# Patient Record
Sex: Female | Born: 1970 | Race: White | Hispanic: No | Marital: Married | Smoking: Never smoker
Health system: Southern US, Community
[De-identification: ages and names within clinical notes are randomized; demographics above are authoritative.]

## PROBLEM LIST (undated history)

## (undated) DIAGNOSIS — I10 Essential (primary) hypertension: Secondary | ICD-10-CM

## (undated) HISTORY — PX: ABDOMINAL SURGERY: SHX537

---

## 2007-04-11 ENCOUNTER — Ambulatory Visit (HOSPITAL_COMMUNITY): Admission: RE | Admit: 2007-04-11 | Discharge: 2007-04-11 | Payer: Self-pay | Admitting: *Deleted

## 2007-05-08 ENCOUNTER — Ambulatory Visit (HOSPITAL_COMMUNITY): Admission: RE | Admit: 2007-05-08 | Discharge: 2007-05-08 | Payer: Self-pay | Admitting: *Deleted

## 2007-09-05 ENCOUNTER — Inpatient Hospital Stay (HOSPITAL_COMMUNITY): Admission: RE | Admit: 2007-09-05 | Discharge: 2007-09-08 | Payer: Self-pay | Admitting: *Deleted

## 2010-08-01 ENCOUNTER — Encounter: Payer: Self-pay | Admitting: *Deleted

## 2010-11-23 NOTE — Op Note (Signed)
NAMEMARLY, Connie [name]               ACCOUNT NO.:  [id number]   MEDICAL RECORD NO.:  [id number]          PATIENT TYPE:  INP   LOCATION:  9109                          FACILITY:  [facility]   PHYSICIAN:  Connie [name], M.D.  DATE OF BIRTH:  Apr 16, 1971   DATE OF PROCEDURE:  09/05/2007  DATE OF DISCHARGE:                               OPERATIVE REPORT   PREOPERATIVE DIAGNOSES:  1. A 39-week intrauterine pregnancy.  2. Breech presentation.   POSTOPERATIVE DIAGNOSIS:  1. A 39-week intrauterine pregnancy.  2. Breech presentation.   PROCEDURE:  Primary low transverse C-section.   SURGEON:  Chester Holstein. [name], M.D.   ASSISTANT:  [name], C.N.M.   ANESTHESIA:  Spinal.   FINDINGS:  Viable female, 8 pounds 1 ounce, frank breech, Apgars 4 and  9.   SPECIMENS:  None.   BLOOD LOSS:  600.   COMPLICATIONS:  None.   Cord pH sent but quantity was not sufficient.   INDICATIONS:  The patient with above history for surgical delivery.  Risks discussed including infection, bleeding, damage to surrounding  organs.   PROCEDURE:  The patient is taken to the operating room.  Spinal  anesthesia obtained.  She is prepped and draped in standard fashion.  Foley catheter inserted to the bladder.  A Pfannenstiel incision was  made, carried sharply to the fascia.  Fascia was divided sharply,  elevated and the underlying rectus muscles dissected off sharply.  Posterior sheath and peritoneum elevated and sharply.  Bladder blade  inserted.  Bladder flap created sharply.   Uterine incision made in low transverse fashion with a knife.  Extended  laterally with bandage scissors.  Clear fluid at amniotomy.  The breech  was elevated to the incision, with fundal pressure delivered, frank  breech encountered.  Therefore, delivered beyond the level of the knees.  The knees were flexed.  Traction placed at the superior iliac crest to  deliver to the level of the scapula.  Each arm was delivered by flexion  of  the elbow.  Head was entrapped, therefore the abdominal and uterine  incisions were extended to facilitate delivery which was accomplished  with fundal pressure and flexion of the neck.   Placenta was removed manually.  Uterus exteriorized, cleared of all  clots and debris.  Uterus closed in two layers with 0-0 chromic.  Hemostasis obtained.  Tubes and ovaries appeared normal.   Uterus inserted into the abdomen, the pelvis irrigated.  The uterine  incision reinspected, found to be hemostatic as was subfascial space and  bladder flap.   Fascia closed with running stitch of 0-0 Vicryl.  Subcutaneous tissue  irrigated, made hemostatic and reapproximated with 0-0 Vicryl.  Skin  closed with staples.   The patient tolerated the procedure well without complications.  She was  taken to recovery room, awake, alert, in stable condition.  All counts  correct per the operating staff.      [name], M.D.  Electronically Signed     WBD/MEDQ  D:  09/05/2007  T:  09/06/2007  Job:  [id number]

## 2010-11-26 NOTE — Discharge Summary (Signed)
[name], [name]               ACCOUNT NO.:  [id number]   MEDICAL RECORD NO.:  [id number]          PATIENT TYPE:  INP   LOCATION:  9109                          FACILITY:  [facility]   PHYSICIAN:  [place] B. Earlene Plater, M.D.  DATE OF BIRTH:  [birth year]   DATE OF ADMISSION:  09/05/2007  DATE OF DISCHARGE:  09/08/2007                               DISCHARGE SUMMARY   ADMISSION DIAGNOSES:  1. A 39-week intrauterine pregnancy.  2. Breech presentation.   DISCHARGE DIAGNOSES:  1. A 39-week intrauterine pregnancy.  2. Breech presentation.   PROCEDURE:  Primary low transverse C-section.   Connie [name] presented at 39 weeks for primary C-section due to breech  presentation after declining attempted ECV.   HOSPITAL COURSE:  The patient was admitted and underwent a primary low  transverse C-section, frank breech female, weight 8 pounds 1 ounce,  Apgars 3 and 9.   Postoperatively, the patient rapidly regained her ability to ambulate,  void, and tolerate regular diet.  She was discharged home on the third  postoperative day in a satisfactory condition.   DISCHARGE INSTRUCTIONS:  Per booklet.  Follow up in 1 week for blood  pressure check as she did have mildly elevated blood pressures while in  the hospital.   DISPOSITION AT DISCHARGE:  Satisfactorily.   DISCHARGE MEDICATIONS:  Tylox 1-2 p.o. q.4 hours p.r.n. pain.      [name], M.D.  Electronically Signed     WBD/MEDQ  D:  10/01/2007  T:  10/01/2007  Job:  [id number]

## 2011-04-01 LAB — CBC
HCT: 29.2 — ABNORMAL LOW
Hemoglobin: 10.1 — ABNORMAL LOW
MCHC: 34.7
MCV: 84.4
RBC: 4.03
RDW: 15
WBC: 10.4
WBC: 11.1 — ABNORMAL HIGH

## 2011-04-01 LAB — COMPREHENSIVE METABOLIC PANEL
Albumin: 1.9 — ABNORMAL LOW
Alkaline Phosphatase: 233 — ABNORMAL HIGH
BUN: 6
Calcium: 7.9 — ABNORMAL LOW
Creatinine, Ser: 0.56
GFR calc Af Amer: >60
GFR calc non Af Amer: >60
Glucose, Bld: 86
Total Bilirubin: 0.6
Total Protein: 4.7 — ABNORMAL LOW

## 2012-07-16 ENCOUNTER — Ambulatory Visit: Payer: Self-pay | Admitting: Emergency Medicine

## 2013-05-16 ENCOUNTER — Ambulatory Visit: Payer: Self-pay | Admitting: Emergency Medicine

## 2013-05-16 LAB — CBC WITH DIFFERENTIAL/PLATELET
Basophil #: 0.1 10*3/uL (ref 0.0–0.1)
Basophil %: 0.9 %
Eosinophil #: 0.2 10*3/uL (ref 0.0–0.7)
Eosinophil %: 2.1 %
HCT: 41.4 % (ref 35.0–47.0)
HGB: 13.8 g/dL (ref 12.0–16.0)
Lymphocyte #: 1.7 10*3/uL (ref 1.0–3.6)
Lymphocyte %: 19.2 %
MCH: 30.3 pg (ref 26.0–34.0)
MCHC: 33.2 g/dL (ref 32.0–36.0)
MCV: 91 fL (ref 80–100)
Monocyte #: 0.8 x10 3/mm (ref 0.2–0.9)
Monocyte %: 8.5 %
Neutrophil #: 6.2 10*3/uL (ref 1.4–6.5)
Neutrophil %: 69.3 %
Platelet: 324 10*3/uL (ref 150–440)
RBC: 4.54 10*6/uL (ref 3.80–5.20)
RDW: 13.2 % (ref 11.5–14.5)
WBC: 8.9 10*3/uL (ref 3.6–11.0)

## 2013-05-16 LAB — RAPID STREP-A WITH REFLX: Micro Text Report: NEGATIVE

## 2013-10-30 ENCOUNTER — Ambulatory Visit: Payer: Self-pay | Admitting: Family Medicine

## 2014-04-01 ENCOUNTER — Ambulatory Visit: Payer: Self-pay | Admitting: Obstetrics and Gynecology

## 2014-05-12 DIAGNOSIS — G4733 Obstructive sleep apnea (adult) (pediatric): Secondary | ICD-10-CM | POA: Insufficient documentation

## 2014-08-31 ENCOUNTER — Ambulatory Visit: Payer: Self-pay | Admitting: Emergency Medicine

## 2015-05-29 ENCOUNTER — Ambulatory Visit
Admission: EM | Admit: 2015-05-29 | Discharge: 2015-05-29 | Disposition: A | Discharge status: Home / Self Care | Payer: BC Managed Care – PPO | Attending: Family Medicine | Admitting: Family Medicine

## 2015-05-29 DIAGNOSIS — J029 Acute pharyngitis, unspecified: Secondary | ICD-10-CM | POA: Diagnosis not present

## 2015-05-29 DIAGNOSIS — J069 Acute upper respiratory infection, unspecified: Secondary | ICD-10-CM | POA: Diagnosis not present

## 2015-05-29 LAB — RAPID STREP SCREEN (MED CTR MEBANE ONLY): Streptococcus, Group A Screen (Direct): NEGATIVE

## 2015-05-29 MED ORDER — GUAIFENESIN-CODEINE 100-10 MG/5ML PO SOLN: 10.0000 mL | Freq: Four times a day (QID) | ORAL | Status: DC | PRN

## 2015-05-29 MED ORDER — LIDOCAINE VISCOUS 2 % MT SOLN: OROMUCOSAL | Status: DC

## 2015-05-29 NOTE — ED Provider Notes (Signed)
CSN: [id number]     Arrival date & time 05/29/15  0907 History   First MD Initiated Contact with Patient 05/29/15 1003     Chief Complaint  Patient presents with  . Sore Throat   (Consider location/radiation/quality/duration/timing/severity/associated sxs/prior Treatment) Patient is a 44 y.o. female presenting with URI. The history is provided by the patient.  URI Presenting symptoms: congestion, cough, ear pain, rhinorrhea and sore throat   Severity:  Moderate Onset quality:  Sudden Duration:  2 days Timing:  Constant Progression:  Worsening Chronicity:  New Relieved by:  None tried Worsened by:  Nothing tried Ineffective treatments:  None tried Associated symptoms: headaches   Associated symptoms: no arthralgias, no myalgias, no neck pain, no sinus pain, no sneezing and no wheezing     History reviewed. No pertinent past medical history. Past Surgical History  Procedure Laterality Date  . Cesarean section      2009   Family History  Problem Relation Age of Onset  . Heart attack Father    Social History  Substance Use Topics  . Smoking status: Never Smoker   . Smokeless tobacco: None  . Alcohol Use: No   OB History    No data available     Review of Systems  HENT: Positive for congestion, ear pain, rhinorrhea and sore throat. Negative for sneezing.   Respiratory: Positive for cough. Negative for wheezing.   Musculoskeletal: Negative for myalgias, arthralgias and neck pain.  Neurological: Positive for headaches.    Allergies  Review of patient's allergies indicates no known allergies.  Home Medications   Prior to Admission medications   Medication Sig Start Date End Date Taking? Authorizing Provider  norethindrone-ethinyl estradiol (JUNEL FE,GILDESS FE,LOESTRIN FE) 1-20 MG-MCG tablet Take 1 tablet by mouth daily.   Yes Historical Provider, MD  guaiFENesin-codeine 100-10 MG/5ML syrup Take 10 mLs by mouth every 6 (six) hours as needed for cough. 05/29/15    Payton Mccallumrlando Linda Biehn, MD  lidocaine (XYLOCAINE) 2 % solution 20 mls gargle and spit q 6 hours prn sore throat 05/29/15   Payton Mccallumrlando Caitlinn Klinker, MD   Meds Ordered and Administered this Visit  Medications - No data to display  BP 159/102 mmHg  Pulse 98  Temp(Src) 99.1 F (37.3 C) (Tympanic)  Resp 18  Ht 5\' 7"  (1.702 m)  Wt 210 lb (95.255 kg)  BMI 32.88 kg/m2  SpO2 99%  LMP 05/20/2015 (Exact Date) No data found.   Physical Exam  Constitutional: She appears well-developed and well-nourished. No distress.  HENT:  Head: Normocephalic.  Right Ear: Tympanic membrane, external ear and ear canal normal.  Left Ear: Tympanic membrane, external ear and ear canal normal.  Nose: Rhinorrhea present.  Mouth/Throat: Oropharynx is clear and moist and mucous membranes are normal.  Eyes: Conjunctivae and EOM are normal. Pupils are equal, round, and reactive to light. Right eye exhibits no discharge. Left eye exhibits no discharge. No scleral icterus.  Neck: Normal range of motion. Neck supple. No JVD present. No tracheal deviation present. No thyromegaly present.  Cardiovascular: Normal rate, regular rhythm, normal heart sounds and intact distal pulses.   No murmur heard. Pulmonary/Chest: Effort normal and breath sounds normal. No stridor. No respiratory distress. She has no wheezes. She has no rales.  Lymphadenopathy:    She has no cervical adenopathy.  Skin: No rash noted. She is not diaphoretic.  Vitals reviewed.   ED Course  Procedures (including critical care time)  Labs Review Labs Reviewed  RAPID STREP SCREEN (NOT AT  [facility])  CULTURE, GROUP A STREP ([facility] ONLY)    Imaging Review No results found.   Visual Acuity Review  Right Eye Distance:   Left Eye Distance:   Bilateral Distance:    Right Eye Near:   Left Eye Near:    Bilateral Near:         MDM   1. Viral URI   2. Pharyngitis    Discharge Medication List as of 05/29/2015 10:59 AM    START taking these medications   Details   guaiFENesin-codeine 100-10 MG/5ML syrup Take 10 mLs by mouth every 6 (six) hours as needed for cough., Starting 05/29/2015, Until Discontinued, Print    lidocaine (XYLOCAINE) 2 % solution 20 mls gargle and spit q 6 hours prn sore throat, Normal       1. Lab results and diagnosis reviewed with patient 2. rx as per orders above; reviewed possible side effects, interactions, risks and benefits  3. Recommend supportive treatment with otc analgesics prn, salt water gargles, increased fluids 4. Follow-up prn if symptoms worsen or don't improve    Payton Mccallum, MD 05/29/15 1424

## 2015-05-29 NOTE — ED Notes (Signed)
Started yesterday with sore throat. Very painful to swallow. Bilateral ear pain when swallowing plus c/o headache

## 2015-05-31 LAB — CULTURE, GROUP A STREP (THRC)

## 2015-08-11 ENCOUNTER — Ambulatory Visit
Admission: EM | Admit: 2015-08-11 | Discharge: 2015-08-11 | Disposition: A | Discharge status: Home / Self Care | Payer: BC Managed Care – PPO | Attending: Family Medicine | Admitting: Family Medicine

## 2015-08-11 ENCOUNTER — Encounter: Payer: Self-pay | Admitting: Emergency Medicine

## 2015-08-11 DIAGNOSIS — B0222 Postherpetic trigeminal neuralgia: Secondary | ICD-10-CM | POA: Diagnosis not present

## 2015-08-11 DIAGNOSIS — R21 Rash and other nonspecific skin eruption: Secondary | ICD-10-CM

## 2015-08-11 MED ORDER — VALACYCLOVIR HCL 1 G PO TABS: 1000.0000 mg | ORAL_TABLET | Freq: Three times a day (TID) | ORAL | Status: DC

## 2015-08-11 NOTE — ED Provider Notes (Signed)
CSN: [id number]     Arrival date & time 08/11/15  1602 History   First MD Initiated Contact with Patient 08/11/15 1718    Nurses notes were reviewed. Chief Complaint  Patient presents with  . Rash   Patient with a rash on the left side of her face or torso eyes she states that she feels little tingling in her eyes as well. She reports tingling start 09  . Abdominal surgery     Family History  Problem Relation Age of Onset  . Heart attack Father    Social History  Substance Use Topics  . Smoking status: Never Smoker   . Smokeless tobacco: None  . Alcohol Use: No   OB History    No data available     Review of Systems    Skin: Positive for rash.  All other systems reviewed and are negative.   Allergies  Review of patient's allergies indicates no known allergies.  Home Medications   Prior to Admission medications   Medication Sig Start Date End Date Taking? Authorizing Provider  norethindrone-ethinyl estradiol (JUNEL FE,GILDESS FE,LOESTRIN FE) 1-20 MG-MCG tablet Take 1 tablet by mouth daily.   Yes Historical Provider, MD  guaiFENesin-codeine 100-10 MG/5ML syrup Take 10 mLs by mouth every 6 (six) hours as needed for cough. 05/29/15   Payton Mccallum, MD  lidocaine (XYLOCAINE) 2 % solution 20 mls gargle and spit q 6 hours prn sore throat 05/29/15   Payton Mccallum, MD  valACYclovir (VALTREX) 1000 MG tablet Take 1 tablet (1,000 mg total) by mouth 3 (three) times daily. 08/11/15   Hassan Rowan, MD   Meds Ordered and Administered this Visit  Medications - No data to display  BP 180/85 mmHg  Pulse 86  Temp(Src) 98.1 F (36.7 C) (Oral)  Resp 18  Ht  (1.702 m)  Wt 215 lb (97.523 kg)  BMI 33.67 kg/m2  SpO2 98%  LMP 07/12/2015 No data found.   Physical Exam  Constitutional: She is oriented to person, place, and time. She appears well-developed and well-nourished.  HENT:  Head: Normocephalic and atraumatic.  Right Ear: External ear normal.  Left Ear: External ear normal.  Mouth/Throat: Oropharynx is clear and moist.  Eyes: Pupils are equal, round, and reactive to light.  Neck: Neck  supple.  Musculoskeletal: Normal range of motion. She exhibits no edema.  Lymphadenopathy:    She has cervical adenopathy.  Neurological: She is alert and oriented to person, place, and time.  Skin: Rash noted. There is erythema.     The rash is now stable left eye and extends from the temporal area into the left thigh is slight redness as well. Since should be noted the patient has a thick enhancing heavy makeup is also hard to visualize extent of this rash as well.  Psychiatric: Her speech is normal. Her mood  appears anxious.  Vitals reviewed.   ED Course  Procedures (including critical care time)  Labs Review Labs Reviewed - No data to display  Imaging Review No results found.   Visual Acuity Review  Right Eye Distance:   Left Eye Distance:   Bilateral Distance:    Right Eye Near:   Left Eye Near:    Bilateral Near:         MDM   1. Rash of face   2. Acute trigeminal herpes zoster      Because of worry about trigeminal neuralgia involving the ophthalmic branch going to place her on Famvir 1 g 3 times a day for 7 days from all sclerae first ophthalmologist said to be involved in her care. Regular work note for today and tomorrow and for Thursday. With Doppler side how long she should be out.  If she does not hear from [place] early morning by 10 to please call us so we can get that appointment set up  Hassan Rowan, MD 08/11/15 1751

## 2015-08-11 NOTE — Discharge Instructions (Signed)
Shingles Shingles is an infection that causes a painful skin rash and fluid-filled blisters. Shingles is caused by the same virus that causes chickenpox. Shingles only develops in people who:  Have had chickenpox.  Have gotten the chickenpox vaccine. (This is rare.) The first symptoms of shingles may be itching, tingling, or pain in an area on your skin. A rash will follow in a few days or weeks. The rash is usually on one side of the body in a bandlike or beltlike pattern. Over time, the rash turns into fluid-filled blisters that break open, scab over, and dry up. Medicines may:  Help you manage pain.  Help you recover more quickly.  Help to prevent long-term problems. HOME CARE Medicines  Take medicines only as told by your doctor.  Apply an anti-itch or numbing cream to the affected area as told by your doctor. Blister and Rash Care  Take a cool bath or put cool compresses on the area of the rash or blisters as told by your doctor. This may help with pain and itching.  Keep your rash covered with a loose bandage (dressing). Wear loose-fitting clothing.  Keep your rash and blisters clean with mild soap and cool water or as told by your doctor.  Check your rash every day for signs of infection. These include redness, swelling, and pain that lasts or gets worse.  Do not pick your blisters.  Do not scratch your rash. General Instructions  Rest as told by your doctor.  Keep all follow-up visits as told by your doctor. This is important.  Until your blisters scab over, your infection can cause chickenpox in people who have never had it or been vaccinated against it. To prevent this from happening, avoid touching other people or being around other people, especially:  Babies.  Pregnant women.  Children who have eczema.  Elderly people who have transplants.  People who have chronic illnesses, such as leukemia or AIDS. GET HELP IF:  Your pain does not get better with  medicine.  Your pain does not get better after the rash heals.  Your rash looks infected. Signs of infection include:  Redness.  Swelling.  Pain that lasts or gets worse. GET HELP RIGHT AWAY IF:  The rash is on your face or nose.  You have pain in your face, pain around your eye area, or loss of feeling on one side of your face.  You have ear pain or you have ringing in your ear.  You have loss of taste.  Your condition gets worse.   This information is not intended to replace advice given to you by your health care provider. Make sure you discuss any questions you have with your health care provider.   Document Released: 12/14/2007 Document Revised: 07/18/2014 Document Reviewed: 04/08/2014 Elsevier Interactive Patient Education 2016 Else[employer] Inc[employer]  Trigeminal Neuralgia Trigeminal neuralgia is a nerve disorder that causes attacks of severe facial pain. The attacks last from a few seconds to several minutes. They can happen for days, weeks, or months and then go away for months or years. Trigeminal neuralgia is also called tic douloureux. CAUSES This condition is caused by damage to a nerve in the face that is called the trigeminal nerve. An attack can be triggered by:  Talking.  Chewing.  Putting on makeup.  Washing your face.  Shaving your face.  Brushing your teeth.  Touching your face. RISK FACTORS This condition is more likely to develop in:  Women.  People who are  69 years of age or older. SYMPTOMS The main symptom of this condition is pain in the jaw, lips, eyes, nose, scalp, forehead, and face. The pain may be intense, stabbing, electric, or shock-like. DIAGNOSIS This condition is diagnosed with a physical exam. A CT scan or MRI may be done to rule out other conditions that can cause facial pain. TREATMENT This condition may be treated with:  Avoiding the things that trigger your attacks.  Pain medicine.  Surgery. This may be done in severe cases  if other medical treatment does not provide relief. HOME CARE INSTRUCTIONS  Take over-the-counter and prescription medicines only as told by your health care provider.  If you wish to get pregnant, talk with your health care provider before you start trying to get pregnant.  Avoid the things that trigger your attacks. It may help to:  Chew on the unaffected side of your mouth.  Avoid touching your face.  Avoid blasts of hot or cold air. SEEK MEDICAL CARE IF:  Your pain medicine is not helping.  You develop new, unexplained symptoms, such as:  Double vision.  Facial weakness.  Changes in hearing or balance.  You become pregnant. SEEK IMMEDIATE MEDICAL CARE IF:  Your pain is unbearable, and your pain medicine does not help.   This information is not intended to replace advice given to you by your health care provider. Make sure you discuss any questions you have with your health care provider.   Document Released: 06/24/2000 Document Revised: 03/18/2015 Document Reviewed: 10/20/2014 Elsevier Interactive Patient Education [employer]

## 2015-08-11 NOTE — ED Notes (Signed)
Pt presents with left side neck lymph node swelling along with rash left side of face since Saturday.

## 2019-02-13 ENCOUNTER — Other Ambulatory Visit: Payer: Self-pay

## 2019-02-13 DIAGNOSIS — Z20822 Contact with and (suspected) exposure to covid-19: Secondary | ICD-10-CM

## 2019-02-14 LAB — NOVEL CORONAVIRUS, NAA: SARS-CoV-2, NAA: DETECTED — AB

## 2019-05-29 ENCOUNTER — Other Ambulatory Visit: Payer: Self-pay

## 2019-05-29 DIAGNOSIS — Z20822 Contact with and (suspected) exposure to covid-19: Secondary | ICD-10-CM

## 2019-06-01 LAB — NOVEL CORONAVIRUS, NAA: SARS-CoV-2, NAA: NOT DETECTED

## 2020-06-29 ENCOUNTER — Ambulatory Visit: Payer: Self-pay | Attending: Internal Medicine

## 2020-06-29 DIAGNOSIS — Z23 Encounter for immunization: Secondary | ICD-10-CM

## 2020-06-29 NOTE — Progress Notes (Signed)
   Covid-19 Vaccination Clinic  Name:  REATHA SUR    MRN: [id number] DOB: 1970/07/20  06/29/2020  Ms. Rickenbach was observed post Covid-19 immunization for 15 minutes without incident. She was provided with Vaccine Information Sheet and instruction to access the V-Safe system.   Ms. Roller was instructed to call 911 with any severe reactions post vaccine: Marland Kitchen Difficulty breathing  . Swelling of face and throat  . A fast heartbeat  . A bad rash all over body  . Dizziness and weakness   Immunizations Administered    Name Date Dose VIS Date Route   Pfizer COVID-19 Vaccine 06/29/2020  2:03 PM 0.3 mL 04/29/2020 Intramuscular   Manufacturer: [employer], [employer]   Lot: [id number]   NDC: [address]-9

## 2021-04-12 ENCOUNTER — Other Ambulatory Visit: Payer: Self-pay

## 2021-04-12 ENCOUNTER — Ambulatory Visit
Admission: RE | Admit: 2021-04-12 | Discharge: 2021-04-12 | Disposition: A | Discharge status: Home / Self Care | Payer: Self-pay | Source: Ambulatory Visit | Attending: Family Medicine | Admitting: Family Medicine

## 2021-04-12 ENCOUNTER — Ambulatory Visit (INDEPENDENT_AMBULATORY_CARE_PROVIDER_SITE_OTHER): Payer: Self-pay

## 2021-04-12 VITALS — BP 152/74 | HR 103 | Temp 100.2°F

## 2021-04-12 DIAGNOSIS — H9209 Otalgia, unspecified ear: Secondary | ICD-10-CM | POA: Insufficient documentation

## 2021-04-12 DIAGNOSIS — J069 Acute upper respiratory infection, unspecified: Secondary | ICD-10-CM

## 2021-04-12 DIAGNOSIS — R059 Cough, unspecified: Secondary | ICD-10-CM

## 2021-04-12 DIAGNOSIS — Z20822 Contact with and (suspected) exposure to covid-19: Secondary | ICD-10-CM | POA: Insufficient documentation

## 2021-04-12 DIAGNOSIS — J029 Acute pharyngitis, unspecified: Secondary | ICD-10-CM | POA: Insufficient documentation

## 2021-04-12 DIAGNOSIS — R509 Fever, unspecified: Secondary | ICD-10-CM

## 2021-04-12 DIAGNOSIS — R0982 Postnasal drip: Secondary | ICD-10-CM | POA: Insufficient documentation

## 2021-04-12 DIAGNOSIS — R490 Dysphonia: Secondary | ICD-10-CM | POA: Insufficient documentation

## 2021-04-12 LAB — SARS CORONAVIRUS 2 (TAT 6-24 HRS): SARS Coronavirus 2: NEGATIVE

## 2021-04-12 MED ORDER — PROMETHAZINE-DM 6.25-15 MG/5ML PO SYRP: 5.0000 mL | ORAL_SOLUTION | Freq: Four times a day (QID) | ORAL | 0 refills | Status: DC | PRN

## 2021-04-12 MED ORDER — ALBUTEROL SULFATE HFA 108 (90 BASE) MCG/ACT IN AERS: 1.0000 | INHALATION_SPRAY | Freq: Four times a day (QID) | RESPIRATORY_TRACT | 0 refills | Status: DC | PRN

## 2021-04-12 NOTE — Discharge Instructions (Signed)
Chest xray was clear.  Rest.  Lots of fluids.  Medications as prescribed.  Awaiting COVID test result (will be back tomorrow).  Take care  Dr. Adriana Simas

## 2021-04-12 NOTE — ED Provider Notes (Signed)
[facility]    CSN: [id number] Arrival date & time: 04/12/21  0944      History   Chief Complaint Chief Complaint  Patient presents with   Cough    HPI  50 year old female presents with respiratory symptoms.  Reports that her symptoms started on Thursday.  She reports hoarseness, cough, postnasal drip, sore throat, ear pain.  No fever.  Has had a negative home COVID test.  Her husband has also been sick with the same symptoms.  He is improving.  Patient states that her sore throat is severe.  No documented fever at home.  Temperature currently 100.2.  No relief with over-the-counter treatment.    Home Medications    Prior to Admission medications   Medication Sig Start Date End Date Taking? Authorizing Provider  albuterol (VENTOLIN HFA) 108 (90 Base) MCG/ACT inhaler Inhale 1-2 puffs into the lungs every 6 (six) hours as needed for wheezing or shortness of breath. 04/12/21  Yes Salah Nakamura G, DO  promethazine-dextromethorphan (PROMETHAZINE-DM) 6.25-15 MG/5ML syrup Take 5 mLs by mouth 4 (four) times daily as needed for cough. 04/12/21  Yes Yevette Knust G, DO  norethindrone-ethinyl estradiol (JUNEL FE,GILDESS FE,LOESTRIN FE) 1-20 MG-MCG tablet Take 1 tablet by mouth daily.    [provider]  valACYclovir (VALTREX) 1000 MG tablet Take 1 tablet (1,000 mg total) by mouth 3 (three) times daily. 08/11/15   Hassan Rowan, MD    Family History Family History  Problem Relation Age of Onset   Heart attack Father     Social History Social History   Tobacco Use   Smoking status: Never  Substance Use Topics   Alcohol use: No     Allergies   Patient has no known allergies.   Review of Systems Review of Systems  Constitutional:  Negative for fever.  HENT:  Positive for ear pain, sore throat and voice change.   Respiratory:  Positive for cough.     Physical Exam Triage Vital Signs ED Triage Vitals  Enc Vitals Group     BP 04/12/21 1012 (!) 152/74      Pulse Rate 04/12/21 1012 (!) 103     Resp --      Temp 04/12/21 1012 100.2 F (37.9 C)     Temp src --      SpO2 04/12/21 1012 95 %     Weight --      Height --      Head Circumference --      Peak Flow --      Pain Score 04/12/21 1009 10     Pain Loc --      Pain Edu? --      Excl. in GC? --    Updated Vital Signs BP (!) 152/74   Pulse (!) 103   Temp 100.2 F (37.9 C)   LMP 03/26/2021   SpO2 95%   Visual Acuity Right Eye Distance:   Left Eye Distance:   Bilateral Distance:    Right Eye Near:   Left Eye Near:    Bilateral Near:     Physical Exam Vitals and nursing note reviewed.  Constitutional:      General: She is not in acute distress. HENT:     Head: Normocephalic and atraumatic.     Right Ear: Tympanic membrane normal.     Left Ear: Tympanic membrane normal.     Mouth/Throat:     Pharynx: Oropharynx is clear.  Eyes:     General:  Right eye: No discharge.        Left eye: No discharge.     Conjunctiva/sclera: Conjunctivae normal.  Cardiovascular:     Rate and Rhythm: Regular rhythm. Tachycardia present.  Pulmonary:     Effort: Pulmonary effort is normal.     Comments: Basilar crackles. Neurological:     Mental Status: She is alert.     UC Treatments / Results  Labs (all labs ordered are listed, but only abnormal results are displayed) Labs Reviewed  SARS CORONAVIRUS 2 (TAT 6-24 HRS)    EKG   Radiology DG Chest 2 View  Result Date: 04/12/2021 CLINICAL DATA:  Cough and fever. EXAM: CHEST - 2 VIEW COMPARISON:  Two-view chest x-ray 08/31/2014 FINDINGS: The heart size and mediastinal contours are within normal limits. Both lungs are clear. The visualized skeletal structures are unremarkable. IMPRESSION: No active cardiopulmonary disease. Electronically Signed   By: Marin Roberts M.D.   On: 04/12/2021 11:31    Procedures Procedures (including critical care time)  Medications Ordered in UC Medications - No data to  display  Initial Impression / Assessment and Plan / UC Course  I have reviewed the triage vital signs and the nursing notes.  Pertinent labs & imaging results that were available during my care of the patient were reviewed by me and considered in my medical decision making (see chart for details).    50 year old female presents with respiratory symptoms.  See HPI.  Given lung findings, chest x-ray was obtained.  Chest x-ray was independently reviewed by me.  Interpretation: No apparent pneumonia.  Negative x-ray.  Given patient's hoarseness and sick contact, I suspect that this is viral.  Awaiting COVID test results.  Treating symptomatically with albuterol and Promethazine DM.  Final Clinical Impressions(s) / UC Diagnoses   Final diagnoses:  Viral URI with cough     Discharge Instructions      Chest xray was clear.  Rest.  Lots of fluids.  Medications as prescribed.  Awaiting COVID test result (will be back tomorrow).  Take care  Dr. Adriana Simas   ED Prescriptions     Medication Sig Dispense Auth. Provider   promethazine-dextromethorphan (PROMETHAZINE-DM) 6.25-15 MG/5ML syrup Take 5 mLs by mouth 4 (four) times daily as needed for cough. 118 mL Daeshon Grammatico G, DO   albuterol (VENTOLIN HFA) 108 (90 Base) MCG/ACT inhaler Inhale 1-2 puffs into the lungs every 6 (six) hours as needed for wheezing or shortness of breath. 18 g Tommie Sams, DO      PDMP not reviewed this encounter.   Tommie Sams, [place] 04/12/21 1713

## 2021-04-12 NOTE — ED Triage Notes (Signed)
Pt presents with a barky cough, runny nose, hoarse voice, sore throat, and ear pressure since Thursday. Pt denies any fever. Pt had negative at home test.

## 2022-08-18 IMAGING — CR DG CHEST 2V
2 series · 2 of 2 positions shown · non-contrast
Comparison: Two-view chest x-ray 08/31/2014

CLINICAL DATA: Cough and fever.

EXAM:
CHEST - 2 VIEW

[chest pa]
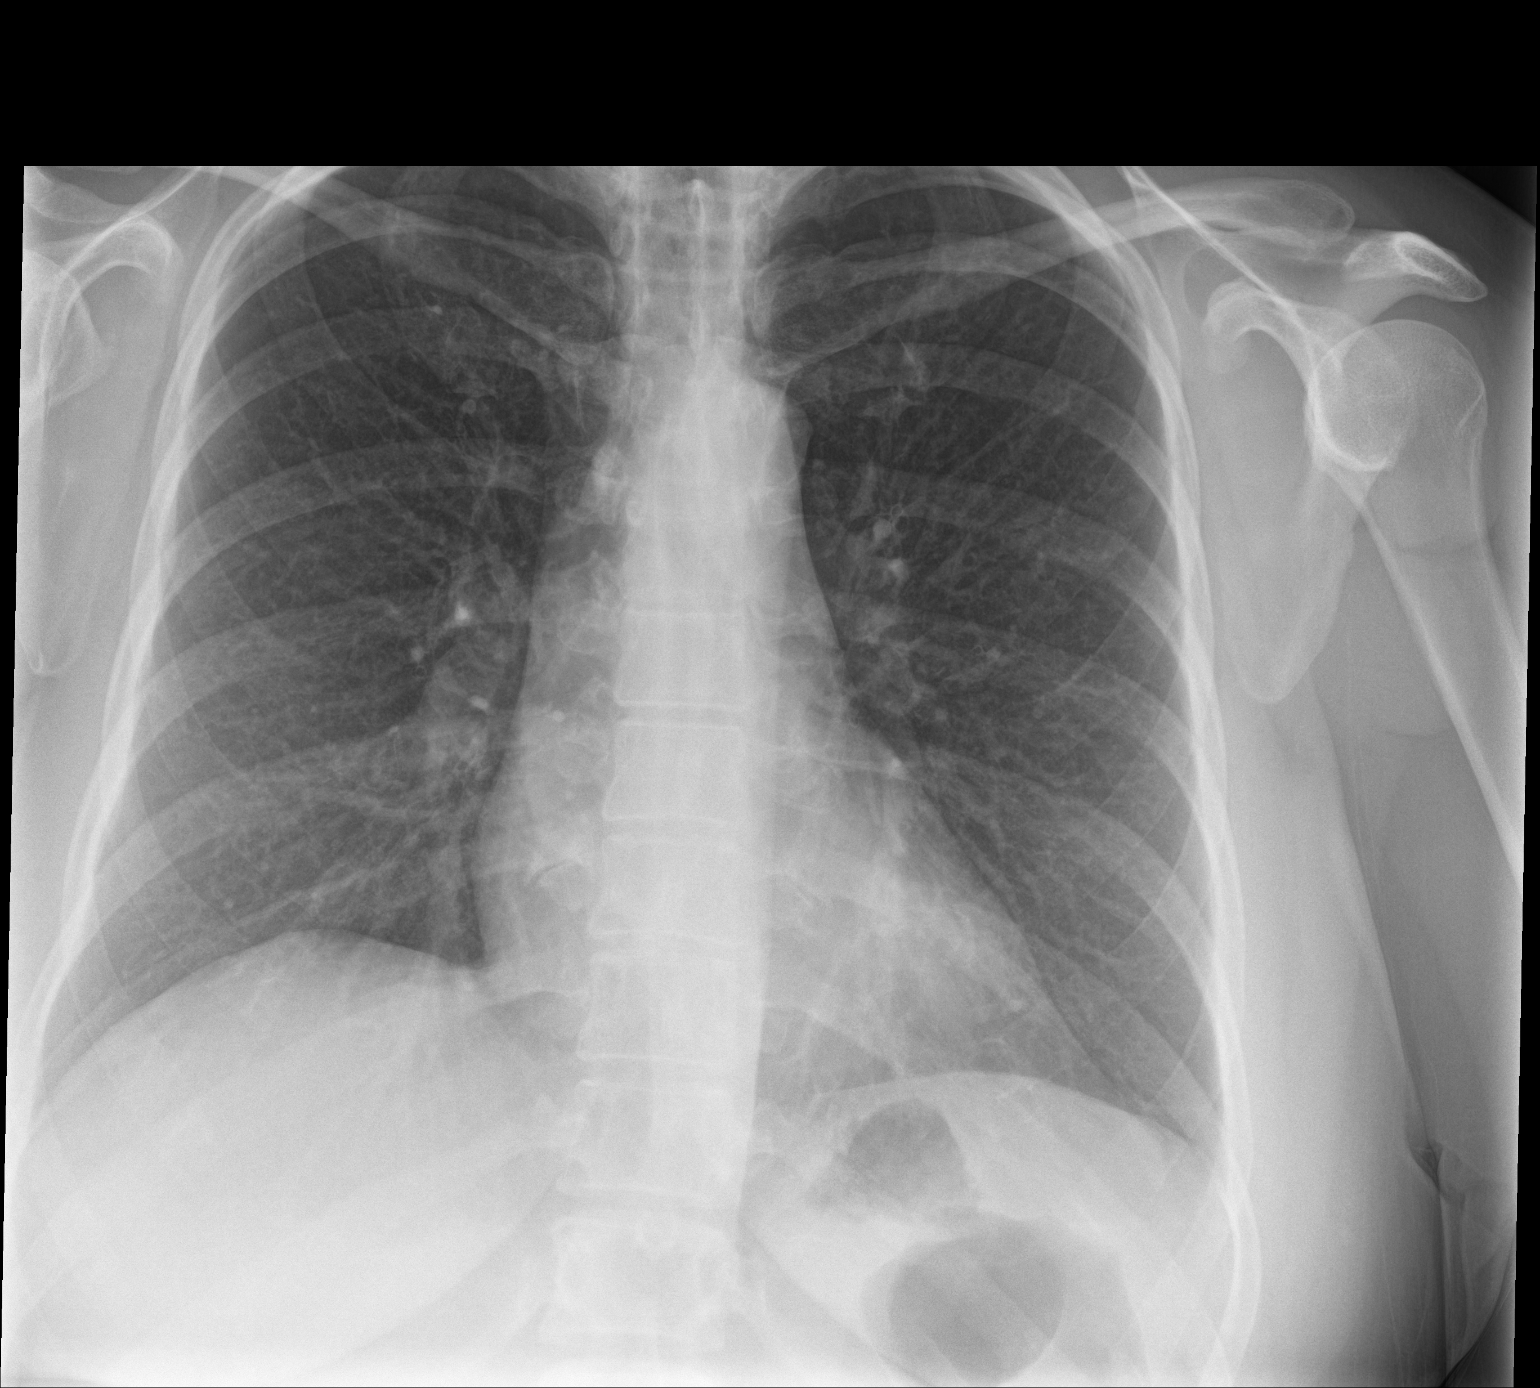

[chest lat]
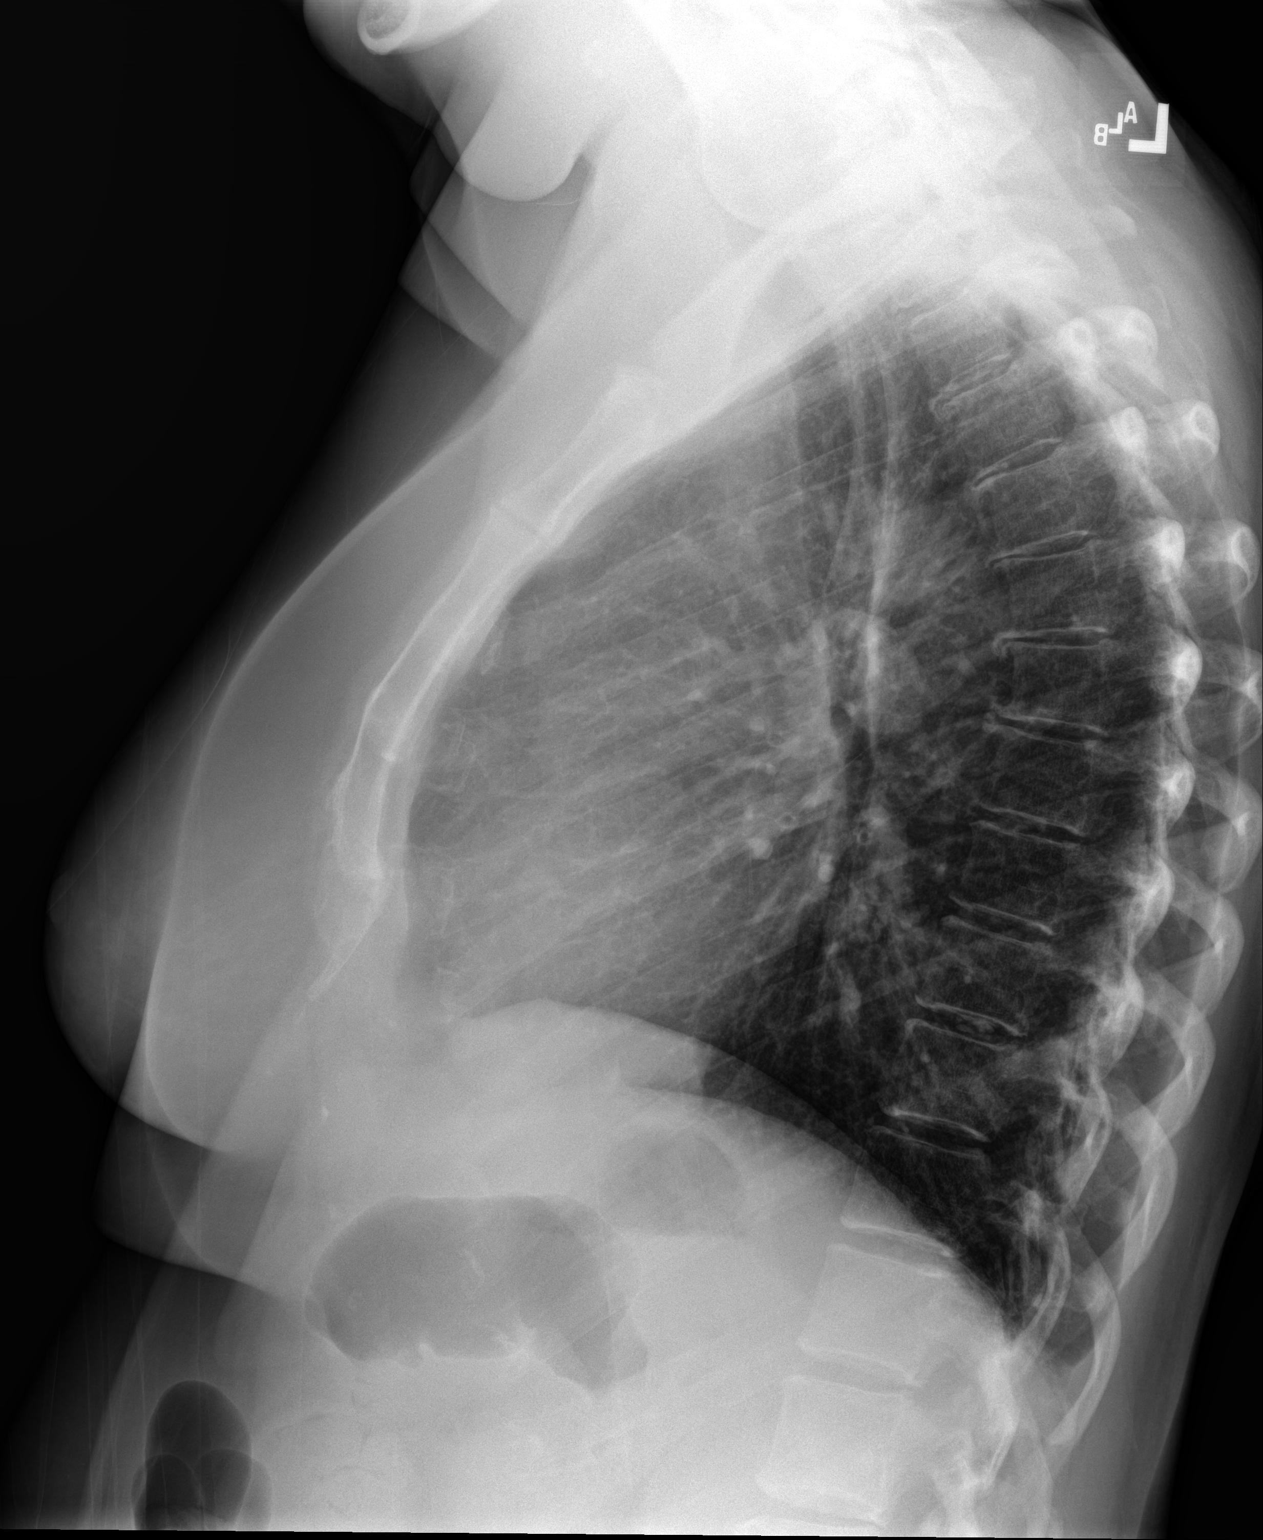

[2 of 2 positions shown; findings below may reference images not displayed]

FINDINGS: The heart size and mediastinal contours are within normal limits.
Both lungs are clear. The visualized skeletal structures are
unremarkable.
IMPRESSION: No active cardiopulmonary disease.

## 2022-09-08 ENCOUNTER — Ambulatory Visit
Admission: EM | Admit: 2022-09-08 | Discharge: 2022-09-08 | Disposition: A | Discharge status: Home / Self Care | Payer: 59 | Attending: Family Medicine | Admitting: Family Medicine

## 2022-09-08 DIAGNOSIS — N3000 Acute cystitis without hematuria: Secondary | ICD-10-CM

## 2022-09-08 DIAGNOSIS — L819 Disorder of pigmentation, unspecified: Secondary | ICD-10-CM | POA: Diagnosis present

## 2022-09-08 HISTORY — DX: Essential (primary) hypertension: I10

## 2022-09-08 LAB — URINALYSIS, W/ REFLEX TO CULTURE (INFECTION SUSPECTED)
Bilirubin Urine: NEGATIVE
Glucose, UA: NEGATIVE mg/dL
Ketones, ur: NEGATIVE mg/dL
Nitrite: NEGATIVE
Protein, ur: NEGATIVE mg/dL
Specific Gravity, Urine: 1.02 (ref 1.005–1.030)
pH: 6.5 (ref 5.0–8.0)

## 2022-09-08 MED ORDER — FLUCONAZOLE 150 MG PO TABS: 150.0000 mg | ORAL_TABLET | ORAL | 0 refills | Status: AC

## 2022-09-08 MED ORDER — CEPHALEXIN 500 MG PO CAPS: 500.0000 mg | ORAL_CAPSULE | Freq: Four times a day (QID) | ORAL | 0 refills | Status: AC

## 2022-09-08 NOTE — ED Provider Notes (Signed)
[facility]    CSN: [id number] Arrival date & time: 09/08/22  1136      History   Chief Complaint Chief Complaint  Patient presents with   Dysuria   Rash    HPI ONORA SCHWISOW is a 52 y.o. female.   HPI   Terecia presents for skin lesion on her back that she noticed while bathing. One spot that is reddish-purple rash "that popped up overnight." Area causes no pain. Had shingles on her face previously and wants to be sure its not shingles again.  There has been no discharge or bleeding from the area.  She does not recall hitting the area or bumping into any furniture.  Having burning with urination, urination frequency and suprapubic pressure. Took AZO for the past couple days which helped her symptoms.  Has UTIs once or twice a year.  No fever, chills, nausea, vomiting, hematuria.       Past Medical History:  Diagnosis Date   Hypertension     There are no problems to display for this patient.   Past Surgical History:  Procedure Laterality Date   ABDOMINAL SURGERY     CESAREAN SECTION     2009    OB History   No obstetric history on file.      Home Medications    Prior to Admission medications   Medication Sig Start Date End Date Taking? Authorizing Provider  cephALEXin (KEFLEX) 500 MG capsule Take 1 capsule (500 mg total) by mouth 4 (four) times daily. 09/08/22  Yes Layci Stenglein, DO  fluconazole (DIFLUCAN) 150 MG tablet Take 1 tablet (150 mg total) by mouth every 3 (three) days for 2 doses. 09/08/22 09/12/22 Yes Mykeisha Dysert, DO  hydrochlorothiazide (MICROZIDE) 12.5 MG capsule Take 12.5 mg by mouth daily. 07/22/22  Yes [provider]  losartan (COZAAR) 50 MG tablet Take 50 mg by mouth daily. 07/22/22  Yes [provider]  albuterol (VENTOLIN HFA) 108 (90 Base) MCG/ACT inhaler Inhale 1-2 puffs into the lungs every 6 (six) hours as needed for wheezing or shortness of breath. 04/12/21   Coral Spikes, DO  norethindrone-ethinyl  estradiol (JUNEL FE,GILDESS FE,LOESTRIN FE) 1-20 MG-MCG tablet Take 1 tablet by mouth daily.    [provider]  promethazine-dextromethorphan (PROMETHAZINE-DM) 6.25-15 MG/5ML syrup Take 5 mLs by mouth 4 (four) times daily as needed for cough. 04/12/21   Coral Spikes, DO  valACYclovir (VALTREX) 1000 MG tablet Take 1 tablet (1,000 mg total) by mouth 3 (three) times daily. 08/11/15   Frederich Cha, MD    Family History Family History  Problem Relation Age of Onset   Heart attack Father     Social History Social History   Tobacco Use   Smoking status: Never  Substance Use Topics   Alcohol use: No     Allergies   Patient has no known allergies.   Review of Systems Review of Systems: negative unless otherwise stated in HPI.      Physical Exam Triage Vital Signs ED Triage Vitals  Enc Vitals Group     BP 09/08/22 1214 (!) 143/85     Pulse Rate 09/08/22 1214 67     Resp --      Temp 09/08/22 1214 98.2 F (36.8 C)     Temp Source 09/08/22 1214 Oral     SpO2 09/08/22 1214 99 %     Weight 09/08/22 1212 170 lb (77.1 kg)     Height 09/08/22 1212 '5\' 7"'$  (  1.702 m)     Head Circumference --      Peak Flow --      Pain Score 09/08/22 1212 0     Pain Loc --      Pain Edu? --      Excl. in [place]? --    No data found.  Updated Vital Signs BP (!) 143/85 (BP Location: Right Arm)   Pulse 67   Temp 98.2 F (36.8 C) (Oral)   Ht '5\' 7"'$  (1.702 m)   Wt 77.1 kg   LMP 09/03/2022 (Approximate)   SpO2 99%   BMI 26.63 kg/m   Visual Acuity Right Eye Distance:   Left Eye Distance:   Bilateral Distance:    Right Eye Near:   Left Eye Near:    Bilateral Near:     Physical Exam GEN:     alert, non-il-appearing female in no distress    HENT:  mucus membranes moist EYES:   pupils equal and reactive, no scleral injection or discharge NECK:  normal ROM RESP:  no increased work of breathing, clear to auscultation bilaterally CVS:   regular rate and rhythm Skin:   warm and dry,  irregular bordered with irregular shading and central line erythematous papule that is nontender to palpation located on the left lower back     UC Treatments / Results  Labs (all labs ordered are listed, but only abnormal results are displayed) Labs Reviewed  URINE CULTURE - Abnormal; Notable for the following components:      Result Value   Culture   (*)    Value: >=100,000 COLONIES/mL ESCHERICHIA COLI SUSCEPTIBILITIES TO FOLLOW Performed at [facility], [address], [place], [place] [address]    All other components within normal limits  URINALYSIS, W/ REFLEX TO CULTURE (INFECTION SUSPECTED) - Abnormal; Notable for the following components:   APPearance HAZY (*)    Hgb urine dipstick SMALL (*)    Leukocytes,Ua TRACE (*)    Bacteria, UA FEW (*)    All other components within normal limits    EKG   Radiology No results found.  Procedures Procedures (including critical care time)  Medications Ordered in UC Medications - No data to display  Initial Impression / Assessment and Plan / UC Course  I have reviewed the triage vital signs and the nursing notes.  Pertinent labs & imaging results that were available during my care of the patient were reviewed by me and considered in my medical decision making (see chart for details).       Pt is a 52 y.o. female with history of hypertension who presents for acute onset left lower back skin lesion.  On exam,  lesion is concerning for possible skin cancer, melanoma.  Advised patient to follow-up with her PCP and to call [facility] to schedule an appointment.  A referral to dermatology was placed.    Regarding her possible UTI symptoms, Eddith is afebrile here without recent antipyretics. Satting well on room air. Overall pt is non-toxic appearing, well hydrated, without respiratory distress.  Urinalysis concerning for acute cystitis.  Hematuria not supported on microscopy.  Treat with Keflex 4 times daily for  5 days.  Patient has history of antibiotic associated yeast infection.  Given Diflucan to help with this.  Typical duration of symptoms discussed.   Return and ED precautions given and voiced understanding. Discussed MDM, treatment plan and plan for follow-up with patient who agrees with plan.  Final Clinical Impressions(s) / UC Diagnoses   Final diagnoses:  Pigmentation abnormality of skin  Acute cystitis without hematuria     Discharge Instructions      Stop by the pharmacy to pick up your prescription for your urinary tract infection.   You need to see another provider (dermatologist) regarding your abnormal skin lesion.  Call the [facility]      ED Prescriptions     Medication Sig Dispense Auth. Provider   cephALEXin (KEFLEX) 500 MG capsule Take 1 capsule (500 mg total) by mouth 4 (four) times daily. 20 capsule Ashlynne Shetterly, DO   fluconazole (DIFLUCAN) 150 MG tablet Take 1 tablet (150 mg total) by mouth every 3 (three) days for 2 doses. 2 tablet Lyndee Hensen, DO      PDMP not reviewed this encounter.   Lyndee Hensen, DO 09/10/22 1815

## 2022-09-08 NOTE — ED Triage Notes (Signed)
Pt states feels like she's fighting a UTI, reports bruning, frequency. Pt states she has taking azo for sxs, sxs onset x2 days   Pt states when she was taking a bath this morning noticed a spot on back (RT side) pt states not itchy, pt states she has also had shingles previously x10 years ago

## 2022-09-08 NOTE — Discharge Instructions (Addendum)
Stop by the pharmacy to pick up your prescription for your urinary tract infection.   You need to see another provider (dermatologist) regarding your abnormal skin lesion.  Call the [facility]

## 2022-09-09 ENCOUNTER — Ambulatory Visit: Payer: Self-pay

## 2022-09-10 LAB — URINE CULTURE: Culture: >=100000 — AB

## 2022-09-11 LAB — URINE CULTURE

## 2022-09-28 ENCOUNTER — Ambulatory Visit: Payer: 59 | Admitting: Dermatology

## 2022-09-28 ENCOUNTER — Other Ambulatory Visit: Payer: Self-pay

## 2022-09-28 ENCOUNTER — Encounter: Payer: Self-pay | Admitting: Dermatology

## 2022-09-28 VITALS — BP 124/79

## 2022-09-28 DIAGNOSIS — D1801 Hemangioma of skin and subcutaneous tissue: Secondary | ICD-10-CM

## 2022-09-28 DIAGNOSIS — L814 Other melanin hyperpigmentation: Secondary | ICD-10-CM | POA: Diagnosis not present

## 2022-09-28 DIAGNOSIS — Z1283 Encounter for screening for malignant neoplasm of skin: Secondary | ICD-10-CM

## 2022-09-28 DIAGNOSIS — L720 Epidermal cyst: Secondary | ICD-10-CM

## 2022-09-28 DIAGNOSIS — D235 Other benign neoplasm of skin of trunk: Secondary | ICD-10-CM

## 2022-09-28 DIAGNOSIS — D485 Neoplasm of uncertain behavior of skin: Secondary | ICD-10-CM

## 2022-09-28 DIAGNOSIS — L570 Actinic keratosis: Secondary | ICD-10-CM

## 2022-09-28 DIAGNOSIS — L821 Other seborrheic keratosis: Secondary | ICD-10-CM

## 2022-09-28 NOTE — Patient Instructions (Addendum)
Patient Handout: Wound Care for Skin Biopsy Site  Patient Handout: Wound Care for Skin Biopsy Site  Taking Care of Your Skin Biopsy Site  Proper care of the biopsy site is essential for promoting healing and minimizing scarring. This handout provides instructions on how to care for your biopsy site to ensure optimal recovery.  1. Cleaning the Wound:  Clean the biopsy site daily with gentle soap and water. Gently pat the area dry with a clean, soft towel. Avoid harsh scrubbing or rubbing the area, as this can irritate the skin and delay healing.  2. Applying Aquaphor and Bandage:  After cleaning the wound, apply a thin layer of Aquaphor ointment to the biopsy site. Cover the area with a sterile bandage to protect it from dirt, bacteria, and friction. Change the bandage daily or as needed if it becomes soiled or wet.  3. Continued Care for One Week:  Repeat the cleaning, Aquaphor application, and bandaging process daily for one week following the biopsy procedure. Keeping the wound clean and moist during this initial healing period will help prevent infection and promote optimal healing.  4. Massaging Aquaphor into the Area:  ---After one week, discontinue the use of bandages but continue to apply Aquaphor to the biopsy site. ----Gently massage the Aquaphor into the area using circular motions. ---Massaging the skin helps to promote circulation and prevent the formation of scar tissue.   Additional Tips:  Avoid exposing the biopsy site to direct sunlight during the healing process, as this can cause hyperpigmentation or worsen scarring. If you experience any signs of infection, such as increased redness, swelling, warmth, or drainage from the wound, contact your healthcare provider immediately. Follow any additional instructions provided by your healthcare provider for caring for the biopsy site and managing any discomfort. Conclusion:  Taking proper care of your skin biopsy site  is crucial for ensuring optimal healing and minimizing scarring. By following these instructions for cleaning, applying Aquaphor, and massaging the area, you can promote a smooth and successful recovery. If you have any questions or concerns about caring for your biopsy site, don't hesitate to contact your healthcare provider for guidance.      Due to recent changes in healthcare laws, you may see results of your pathology and/or laboratory studies on MyChart before the doctors have had a chance to review them. We understand that in some cases there may be results that are confusing or concerning to you. Please understand that not all results are received at the same time and often the doctors may need to interpret multiple results in order to provide you with the best plan of care or course of treatment. Therefore, we ask that you please give [place] 2 business days to thoroughly review all your results before contacting the office for clarification. Should we see a critical lab result, you will be contacted sooner.   If You Need Anything After Your Visit  If you have any questions or concerns for your doctor, please call our main line at [phone] If no one answers, please leave a voicemail as directed and we will return your call as soon as possible. Messages left after 4 pm will be answered the following business day.   You may also send [place] a message via [place]. We typically respond to MyChart messages within 1-2 business days.  For prescription refills, please ask your pharmacy to contact our office. Our fax number is [phone].  If you have an urgent issue when the clinic is  closed that cannot wait until the next business day, you can page your doctor at the number below.    Please note that while we do our best to be available for urgent issues outside of office hours, we are not available 24/7.   If you have an urgent issue and are unable to reach [place], you may choose to seek medical care  at your doctor's office, retail clinic, urgent care center, or emergency room.  If you have a medical emergency, please immediately call 911 or go to the emergency department. In the event of inclement weather, please call our main line at [phone] for an update on the status of any delays or closures.  Dermatology Medication Tips: Please keep the boxes that topical medications come in in order to help keep track of the instructions about where and how to use these. Pharmacies typically print the medication instructions only on the boxes and not directly on the medication tubes.   If your medication is too expensive, please contact our office at [phone] or send [place] a message through [place].   We are unable to tell what your co-pay for medications will be in advance as this is different depending on your insurance coverage. However, we may be able to find a substitute medication at lower cost or fill out paperwork to get insurance to cover a needed medication.   If a prior authorization is required to get your medication covered by your insurance company, please allow [place] 1-2 business days to complete this process.  Drug prices often vary depending on where the prescription is filled and some pharmacies may offer cheaper prices.  The website www.goodrx.com contains coupons for medications through different pharmacies. The prices here do not account for what the cost may be with help from insurance (it may be cheaper with your insurance), but the website can give you the price if you did not use any insurance.  - You can print the associated coupon and take it with your prescription to the pharmacy.  - You may also stop by our office during regular business hours and pick up a GoodRx coupon card.  - If you need your prescription sent electronically to a different pharmacy, notify our office through [facility] or by phone at [phone]

## 2022-09-28 NOTE — Progress Notes (Unsigned)
New Patient Visit  Subjective  Connie [name] is a 52 y.o. female who presents for the following: Other (Spot on back that was inflamed about a month ago. She was seen then at an Urgent Care and they suggested she get it checked. The patient presents for Total-Body Skin Exam (TBSE) for skin cancer screening and mole check.  The patient has spots, moles and lesions to be evaluated, some may be new or changing and the patient has concerns that these could be cancer./).    The following portions of the chart were reviewed this encounter and updated as appropriate:   Tobacco  Allergies  Problems  Med Hx  Surg Hx  Fam Hx      Review of Systems:  No other skin or systemic complaints except as noted in HPI or Assessment and Plan.  Objective  Well appearing patient in no apparent distress; mood and affect are within normal limits.  A full examination was performed including scalp, head, eyes, ears, nose, lips, neck, chest, axillae, abdomen, back, buttocks, bilateral upper extremities, bilateral lower extremities, hands, feet, fingers, toes, fingernails, and toenails. All findings within normal limits unless otherwise noted below.  Left Lower Back SQ nodule   Left pubic area Dark brown black macule 0.6 cm         Assessment & Plan   Lentigines - Scattered tan macules - Due to sun exposure - Benign-appearing, observe - Recommend daily broad spectrum sunscreen SPF 30+ to sun-exposed areas, reapply every 2 hours as needed. - Call for any changes  Seborrheic Keratoses - Stuck-on, waxy, tan-brown papules and/or plaques  - Benign-appearing - Discussed benign etiology and prognosis. - Observe - Call for any changes  Melanocytic Nevi - Tan-brown and/or pink-flesh-colored symmetric macules and papules - Benign appearing on exam today - Observation - Call clinic for new or changing moles - Recommend daily use of broad spectrum spf 30+ sunscreen to sun-exposed areas.    Hemangiomas - Red papules - Discussed benign nature - Observe - Call for any changes  Actinic Damage - Chronic condition, secondary to cumulative UV/sun exposure - diffuse scaly erythematous macules with underlying dyspigmentation - Recommend daily broad spectrum sunscreen SPF 30+ to sun-exposed areas, reapply every 2 hours as needed.  - Staying in the shade or wearing long sleeves, sun glasses (UVA+UVB protection) and wide brim hats (4-inch brim around the entire circumference of the hat) are also recommended for sun protection.  - Call for new or changing lesions.  Skin cancer screening performed today.  Epidermal inclusion cyst Left Lower Back  Benign-appearing.  Observation.  Call clinic for new or changing lesions.  Recommend daily use of broad spectrum spf 30+ sunscreen to sun-exposed areas.    Neoplasm of uncertain behavior of skin Left pubic area  Epidermal / dermal shaving  Lesion diameter (cm):  0.6 Informed consent: discussed and consent obtained   Timeout: patient name, date of birth, surgical site, and procedure verified   Procedure prep:  Patient was prepped and draped in usual sterile fashion Prep type:  Isopropyl alcohol Anesthesia: the lesion was anesthetized in a standard fashion   Anesthetic:  1% lidocaine w/ epinephrine 1-100,000 buffered w/ 8.4% NaHCO3 Instrument used: flexible razor blade   Hemostasis achieved with: pressure, aluminum chloride and electrodesiccation   Outcome: patient tolerated procedure well   Post-procedure details: sterile dressing applied and wound care instructions given   Dressing type: bandage and petrolatum    Specimen 1 - Surgical pathology Differential  Diagnosis: Nevus vs dysplastic nevus Check Margins: No   Return in about 1 year (around 09/28/2023).  I, Ashok Cordia, CMA, am acting as scribe for Ellard Artis, MD .

## 2024-05-22 ENCOUNTER — Ambulatory Visit: Payer: Self-pay | Admitting: Physician Assistant

## 2024-05-22 ENCOUNTER — Ambulatory Visit
Admission: RE | Admit: 2024-05-22 | Discharge: 2024-05-22 | Disposition: A | Discharge status: Home / Self Care | Source: Ambulatory Visit | Attending: Family Medicine | Admitting: Family Medicine

## 2024-05-22 ENCOUNTER — Ambulatory Visit (INDEPENDENT_AMBULATORY_CARE_PROVIDER_SITE_OTHER)

## 2024-05-22 VITALS — BP 137/88 | HR 91 | Temp 100.1°F | Resp 16 | Wt 226.0 lb

## 2024-05-22 DIAGNOSIS — R509 Fever, unspecified: Secondary | ICD-10-CM

## 2024-05-22 DIAGNOSIS — R051 Acute cough: Secondary | ICD-10-CM | POA: Diagnosis not present

## 2024-05-22 DIAGNOSIS — J209 Acute bronchitis, unspecified: Secondary | ICD-10-CM | POA: Diagnosis not present

## 2024-05-22 LAB — POC SOFIA SARS ANTIGEN FIA: SARS Coronavirus 2 Ag: NEGATIVE

## 2024-05-22 MED ORDER — ALBUTEROL SULFATE HFA 108 (90 BASE) MCG/ACT IN AERS: 1.0000 | INHALATION_SPRAY | Freq: Four times a day (QID) | RESPIRATORY_TRACT | 0 refills | Status: AC | PRN

## 2024-05-22 MED ORDER — PROMETHAZINE-DM 6.25-15 MG/5ML PO SYRP
5.0000 mL | ORAL_SOLUTION | Freq: Four times a day (QID) | ORAL | 0 refills | Status: AC | PRN
Start: 2024-05-22 — End: ?

## 2024-05-22 MED ORDER — PREDNISONE 20 MG PO TABS: 40.0000 mg | ORAL_TABLET | Freq: Every day | ORAL | 0 refills | Status: AC

## 2024-05-22 NOTE — Discharge Instructions (Signed)
-   Negative COVID test. - Symptoms are consistent with bronchitis.  I did not see definitive pneumonia on the x-ray but I will contact you if the radiologist does not send antibiotics. - Bronchitis can last for a few weeks.  I sent an inhaler, cough medicine and a corticosteroid. - Please return if higher temperatures greater than 101, worsening breathing, chest pain, weakness or not improving in a couple weeks.

## 2024-05-22 NOTE — ED Triage Notes (Signed)
 Pt c/o cough. States sporadic coughing fits.  Slight runny nose. Ongoing for several days now. - Entered by patient. Denies any hx of asthma or COPD.

## 2024-05-22 NOTE — ED Provider Notes (Signed)
 [facility]    CSN: [id number] Arrival date & time: 05/22/24  0856      History   Chief Complaint Chief Complaint  Patient presents with   Cough    HPI Connie [name] is a 53 y.o. female with history of severe OSA, hypertension and obesity. She presents today for 4 day history of fatigue, cough, congestion, voice hoarseness.  Denies fever, runny nose, ear pain, sinus pain, chest pain, wheezing, shortness of breath, abdominal pain, vomiting or diarrhea.  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 Patient has been taking over-the-counter meds without relief. No other complaints.   HPI  Past Medical History:  Diagnosis Date   Hypertension     Patient Active Problem List   Diagnosis Date Noted   Severe obstructive sleep apnea 05/12/2014    Past Surgical History:  Procedure Laterality Date   ABDOMINAL SURGERY     CESAREAN SECTION     2009    OB History   No obstetric history on file.      Home Medications    Prior to Admission medications   Medication Sig Start Date End Date Taking? Authorizing Provider  albuterol  (VENTOLIN  HFA) 108 (90 Base) MCG/ACT inhaler Inhale 1-2 puffs into the lungs every 6 (six) hours as needed for wheezing or shortness of breath. 05/22/24  Yes Arvis Jolan NOVAK, PA-C  predniSONE (DELTASONE) 20 MG tablet Take 2 tablets (40 mg total) by mouth daily for 5 days. 05/22/24 05/27/24 Yes Arvis Jolan NOVAK, PA-C  promethazine -dextromethorphan (PROMETHAZINE -DM) 6.25-15 MG/5ML syrup Take 5 mLs by mouth 4 (four) times daily as needed. 05/22/24  Yes Arvis Jolan B, PA-C  cephALEXin  (KEFLEX ) 500 MG capsule Take 1 capsule (500 mg total) by mouth 4 (four) times daily. 09/08/22   Brimage, Vondra, DO  escitalopram (LEXAPRO) 20 MG tablet Take 20 mg by mouth daily.    [provider]  hydrochlorothiazide (MICROZIDE) 12.5 MG capsule Take 12.5 mg by mouth daily. 07/22/22   [provider]  losartan (COZAAR) 50 MG tablet Take 50 mg by mouth daily.  07/22/22   [provider]  norethindrone-ethinyl estradiol (JUNEL FE,GILDESS FE,LOESTRIN FE) 1-20 MG-MCG tablet Take 1 tablet by mouth daily.    [provider]    Family History Family History  Problem Relation Age of Onset   Heart attack Father     Social History Social History   Tobacco Use   Smoking status: Never  Substance Use Topics   Alcohol use: No     Allergies   Patient has no known allergies.   Review of Systems Review of Systems  Constitutional:  Positive for fatigue. Negative for chills, diaphoresis and fever.  HENT:  Positive for congestion and voice change. Negative for ear pain, rhinorrhea, sinus pain and sore throat.   Respiratory:  Positive for cough. Negative for shortness of breath.   Cardiovascular:  Negative for chest pain.  Gastrointestinal:  Negative for abdominal pain, nausea and vomiting.  Musculoskeletal:  Negative for arthralgias and myalgias.  Skin:  Negative for rash.  Neurological:  Negative for weakness and headaches.  Hematological:  Negative for adenopathy.     Physical Exam Triage Vital Signs ED Triage Vitals  Encounter Vitals Group     BP 05/22/24 0914 137/88     Girls Systolic BP Percentile --      Girls Diastolic BP Percentile --      Boys Systolic BP Percentile --      Boys Diastolic BP Percentile --  Pulse Rate 05/22/24 0914 91     Resp 05/22/24 0914 16     Temp 05/22/24 0914 100.1 F (37.8 C)     Temp Source 05/22/24 0914 Oral     SpO2 05/22/24 0914 96 %     Weight 05/22/24 0912 226 lb (102.5 kg)     Height --      Head Circumference --      Peak Flow --      Pain Score 05/22/24 0913 0     Pain Loc --      Pain Education --      Exclude from Growth Chart --    No data found.  Updated Vital Signs BP 137/88 (BP Location: Left Arm)   Pulse 91   Temp 100.1 F (37.8 C) (Oral)   Resp 16   Wt 226 lb (102.5 kg)   LMP 05/01/2024 (Approximate)   SpO2 96%   BMI 35.40 kg/m  :     Physical  Exam Vitals and nursing note reviewed.  Constitutional:      General: She is not in acute distress.    Appearance: Normal appearance. She is ill-appearing. She is not toxic-appearing.  HENT:     Head: Normocephalic and atraumatic.     Nose: Congestion present.     Mouth/Throat:     Mouth: Mucous membranes are moist.     Pharynx: Oropharynx is clear.  Eyes:     General: No scleral icterus.       Right eye: No discharge.        Left eye: No discharge.     Conjunctiva/sclera: Conjunctivae normal.  Cardiovascular:     Rate and Rhythm: Normal rate and regular rhythm.     Heart sounds: Normal heart sounds.  Pulmonary:     Effort: Pulmonary effort is normal. No respiratory distress.     Breath sounds: Rhonchi present.  Musculoskeletal:     Cervical back: Neck supple.  Skin:    General: Skin is dry.  Neurological:     General: No focal deficit present.     Mental Status: She is alert. Mental status is at baseline.     Motor: No weakness.     Gait: Gait normal.  Psychiatric:        Mood and Affect: Mood normal.        Behavior: Behavior normal.      UC Treatments / Results  Labs (all labs ordered are listed, but only abnormal results are displayed) Labs Reviewed  POC SOFIA SARS ANTIGEN FIA - Normal    EKG   Radiology No results found.  Procedures Procedures (including critical care time)  Medications Ordered in UC Medications - No data to display  Initial Impression / Assessment and Plan / UC Course  I have reviewed the triage vital signs and the nursing notes.  Pertinent labs & imaging results that were available during my care of the patient were reviewed by me and considered in my medical decision making (see chart for details).   53 year old female with history of sleep apnea, hypertension and obesity presents for 4-day history of cough and congestion.  Reports a lot of coughing fits.  No recorded fever but temperature is 100.1 degrees in clinic.  Possibly  low-grade temps.  Symptoms have not gotten any better over the past few days.  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  Taking OTC meds without relief.  She is ill-appearing but nontoxic.  On exam has  mild nasal congestion.  Few scattered rhonchi which clear with cough.  COVID test negative.  Chest x-ray wet read negative.  Acute bronchitis.  Supportive care encouraged with increased rest and fluids.  Sent albuterol  inhaler, Promethazine  DM, and prednisone.  Encouraged to return if not getting better within the next couple weeks or if she develops worsening shortness of breath, fever, chest pain.  Explained symptoms can last for a few weeks.  Work note was given.  Negative.  No change to treatment plan.  Acute illness with systemic symptoms.   Final Clinical Impressions(s) / UC Diagnoses   Final diagnoses:  Acute cough  Acute bronchitis, unspecified organism  Low grade fever     Discharge Instructions      - Negative COVID test. - Symptoms are consistent with bronchitis.  I did not see definitive pneumonia on the x-ray but I will contact you if the radiologist does not send antibiotics. - Bronchitis can last for a few weeks.  I sent an inhaler, cough medicine and a corticosteroid. - Please return if higher temperatures greater than 101, worsening breathing, chest pain, weakness or not improving in a couple weeks.     ED Prescriptions     Medication Sig Dispense Auth. Provider   predniSONE (DELTASONE) 20 MG tablet Take 2 tablets (40 mg total) by mouth daily for 5 days. 10 tablet Arvis Jolan NOVAK, PA-C   albuterol  (VENTOLIN  HFA) 108 (90 Base) MCG/ACT inhaler Inhale 1-2 puffs into the lungs every 6 (six) hours as needed for wheezing or shortness of breath. 1 each Arvis Jolan NOVAK, PA-C   promethazine -dextromethorphan (PROMETHAZINE -DM) 6.25-15 MG/5ML syrup Take 5 mLs by mouth 4 (four) times daily as needed. 118 mL Arvis Jolan NOVAK, PA-C      PDMP not reviewed this  encounter.   Arvis Jolan NOVAK, PA-C 05/22/24 1043
